# Patient Record
Sex: Male | Born: 2000 | Race: Black or African American | Hispanic: No | Marital: Single | Smoking: Never smoker
Health system: Southern US, Community
[De-identification: ages and names within clinical notes are randomized; demographics above are authoritative.]

## PROBLEM LIST (undated history)

## (undated) HISTORY — PX: KNEE ARTHROSCOPY: SUR90

---

## 2019-03-23 ENCOUNTER — Other Ambulatory Visit: Payer: Self-pay

## 2019-03-23 ENCOUNTER — Encounter: Payer: Self-pay | Admitting: Specialist

## 2019-03-23 ENCOUNTER — Ambulatory Visit: Payer: Self-pay

## 2019-03-23 ENCOUNTER — Ambulatory Visit: Payer: Federal, State, Local not specified - PPO | Admitting: Specialist

## 2019-03-23 VITALS — BP 123/78 | Ht 74.0 in | Wt 250.0 lb

## 2019-03-23 DIAGNOSIS — M25532 Pain in left wrist: Secondary | ICD-10-CM

## 2019-03-23 DIAGNOSIS — S6982XA Other specified injuries of left wrist, hand and finger(s), initial encounter: Secondary | ICD-10-CM

## 2019-03-23 DIAGNOSIS — S63501D Unspecified sprain of right wrist, subsequent encounter: Secondary | ICD-10-CM

## 2019-03-23 NOTE — Progress Notes (Addendum)
Office Visit Note   Patient: Leonard Perkins           Date of Birth: [birth year]           MRN: [id number] Visit Date: 03/23/2019              Requested by: No referring provider defined for this encounter. PCP: No primary care provider on file.   Assessment & Plan: Visit Diagnoses:  1. Pain in left wrist   2. Sprain of right wrist, subsequent encounter   3. 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  Motrin  or narprosyn for pain. Work on wrist curls to strength the wrist flexors and extensors.   Follow-Up Instructions: Return in about 4 weeks (around 04/20/2019).   Orders:  Orders Placed This Encounter  Procedures  . XR Wrist Complete Left  . MR Wrist Left w/o contrast   No orders of the defined types were placed in this encounter.     Procedures: No procedures performed   Clinical Data: No additional findings.   Subjective: Chief Complaint  Patient presents with  . 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 Had history of COVID 19 positive illness with loss of sense of taste and smell and this was almost one month ago and  His taste and smell has returned. He has been having left wrist pain for 3.5 to 4 weeks. The pain in the left wrist began while doing power clings and when transitioning to lifting to flexion of the left elbow the left elbow hit his left thigh and he hyperextended the left wrist. He hurt the right wrist in the past in HS as Junior he was doing a reach block and team mate ran into his right dorsal wrist with his body. He plays Defensive End.  Pain in the left wrist, with flexion or extension and forced flexion or extension. When he  applies weight across the left wrist, power Clings, dumb bell curls and doing back squats with the wrist hyper extended to hold weights on His shoulders is pain. Decrease grip strength left hand with ulnar wrist pain and weakness in Lifting that persists with swelling in the left wrist.       West Progression Recent Vital Signs  BP 123/78 (BP Location: Left Arm, Patient Position: Sitting, Cuff Size: Normal)   Ht 6\' 2"  (1.88 m)   Wt 250 lb (113.4 kg)   BMI 32.10 kg/m    History reviewed. No pertinent past medical history.   Expected Discharge Date    Diet Order    None      VTE Documentation     Work Intensity Score/Level of Care    @LEVELOFCARE @   Mobility       Significant Events   DC Barriers  Abnormal Labs:  03/23/2019, 10:01 AM -4 e   Review of Systems  Constitutional: Negative.  Negative for activity change, appetite change, chills, diaphoresis, fatigue, fever and unexpected weight change.  HENT: Negative.   Eyes: Negative.  Negative for photophobia and redness.  Respiratory: Negative.  Negative for apnea, cough, choking, chest tightness, shortness of breath, wheezing and stridor.  Cardiovascular: Negative.   Gastrointestinal: Negative.  Negative for abdominal distention, abdominal pain, anal bleeding, blood in stool, constipation, diarrhea, nausea, rectal pain and vomiting.  Endocrine: Negative.  Negative for cold intolerance, heat intolerance, polydipsia, polyphagia and polyuria.  Genitourinary: Negative.  Negative for decreased urine volume, difficulty urinating, discharge, dysuria, enuresis, flank pain, frequency, genital sores, hematuria, penile pain, penile swelling, scrotal swelling, testicular pain and urgency.  Musculoskeletal: Negative.   Skin: Negative.   Allergic/Immunologic: Negative.  Negative for environmental allergies, food allergies and immunocompromised state.  Neurological: Negative.   Hematological: Negative.     Psychiatric/Behavioral: Negative.      Objective: Vital Signs: BP 123/78 (BP Location: Left Arm, Patient Position: Sitting, Cuff Size: Normal)   Ht 6\' 2"  (1.88 m)   Wt 250 lb (113.4 kg)   BMI 32.10 kg/m   Physical Exam Constitutional:      Appearance: He is well-developed.  HENT:     Head: Normocephalic and atraumatic.  Eyes:     Pupils: Pupils are equal, round, and reactive to light.  Pulmonary:     Effort: Pulmonary effort is normal.     Breath sounds: Normal breath sounds.  Abdominal:     General: Bowel sounds are normal.     Palpations: Abdomen is soft.  Musculoskeletal:        General: Normal range of motion.     Cervical back: Normal range of motion and neck supple.  Skin:    General: Skin is warm and dry.  Neurological:     Mental Status: He is alert and oriented to person, place, and time.  Psychiatric:        Behavior: Behavior normal.        Thought Content: Thought content normal.        Judgment: Judgment normal.     Right Hand Exam   Tenderness  The patient is experiencing no tenderness.   Range of Motion  Wrist  Extension: normal  Flexion: normal  Pronation: normal  Supination: normal  Hand  MP Thumb: normal  MP Index: normal  MP Middle: normal  PIP Index: normal  PIP Middle: normal  DIP Thumb: normal  DIP Index: normal  DIP Middle: normal    Left Hand Exam   Tenderness  The patient is experiencing tenderness in the dorsal area, palmar area and ulnar area.   Range of Motion  Wrist  Extension: normal  Flexion: normal  Pronation: normal  Supination: normal  Hand  MP Thumb: normal  MP Index: normal  MP Middle: normal  MP Ring: normal  MP Little: normal  PIP Index: normal  PIP Middle: normal  PIP Ring: normal  PIP Little: normal  DIP Thumb: normal  DIP Index: normal  DIP Middle: normal  DIP Ring: normal  DIP Little: normal   Muscle Strength  Wrist extension: 4/5  Wrist flexion: 4/5  Grip:  4/5   Tests  Phalen's  Sign: negative Tinel's sign (median nerve): negative Finkelstein's test: negative  Other  Erythema: absent Scars: absent Sensation: normal Pulse: present  Comments:  Weak left hand grip 4-/5 Pain with ulnar deviation and flexion and extension over left distal ulna-carpal joint. Watson Click test is negative.        Specialty Comments:  No specialty comments available.  Imaging: XR Wrist Complete Left  Result Date: 03/23/2019 Left wrist AP lateral and oblique radiographs show the joint lines are well maintained, no DISI or VISI instability noted, no acute changes. Minimal ulnar minus  variation no abnormal calcification. Wrist DF and VF and ulnar and radial stress show no abnormal widening of the intercarpal joints.     PMFS History: There are no problems to display for this patient.  History reviewed. No pertinent past medical history.  Family History  Problem Relation Age of Onset  . Diabetes Father   . Hypertension Father   . Bronchitis Sister     Past Surgical History:  Procedure Laterality Date  . KNEE ARTHROSCOPY     Social History   Occupational History  . Not on file  Tobacco Use  . Smoking status: Never Smoker  . Smokeless tobacco: Never Used  Substance and Sexual Activity  . Alcohol use: Never  . Drug use: Never  . Sexual activity: Never

## 2019-03-23 NOTE — Patient Instructions (Signed)
Left wrist splint for when performing exercises and weight lifting  MRI left wrist to evaluate for TFCC ligamentous complex injury.  Motrin  or narprosyn for pain. Work on wrist curls to strength the wrist flexors and extensors.

## 2019-03-24 ENCOUNTER — Telehealth: Payer: Self-pay | Admitting: Specialist

## 2019-03-24 NOTE — Telephone Encounter (Signed)
Paitent sched for 03/27/2019 @ 230

## 2019-03-24 NOTE — Telephone Encounter (Signed)
Spoke with patient's mother Stoney Bang concerning appointment for MRI review with Dr. Louanne Skye. Juanita advised patient will be leaving for school and will need to have the MRI  review done the week of the 22nd. The MRI is scheduled 03/28/2019. The number to contact [facility] [place] is [phone]

## 2019-03-25 ENCOUNTER — Other Ambulatory Visit: Payer: Self-pay

## 2019-03-25 ENCOUNTER — Ambulatory Visit (HOSPITAL_BASED_OUTPATIENT_CLINIC_OR_DEPARTMENT_OTHER)
Admission: RE | Admit: 2019-03-25 | Discharge: 2019-03-25 | Disposition: A | Discharge status: Home / Self Care | Payer: Federal, State, Local not specified - PPO | Source: Ambulatory Visit | Attending: Specialist | Admitting: Specialist

## 2019-03-25 DIAGNOSIS — S6982XA Other specified injuries of left wrist, hand and finger(s), initial encounter: Secondary | ICD-10-CM | POA: Diagnosis present

## 2019-03-25 DIAGNOSIS — S63501D Unspecified sprain of right wrist, subsequent encounter: Secondary | ICD-10-CM | POA: Diagnosis present

## 2019-03-25 DIAGNOSIS — M25532 Pain in left wrist: Secondary | ICD-10-CM | POA: Diagnosis not present

## 2019-03-27 ENCOUNTER — Other Ambulatory Visit: Payer: Self-pay

## 2019-03-27 ENCOUNTER — Ambulatory Visit: Payer: Federal, State, Local not specified - PPO | Admitting: Specialist

## 2019-03-27 ENCOUNTER — Encounter: Payer: Self-pay | Admitting: Specialist

## 2019-03-27 VITALS — BP 140/73 | HR 68 | Ht 75.0 in | Wt 253.0 lb

## 2019-03-27 DIAGNOSIS — S66912A Strain of unspecified muscle, fascia and tendon at wrist and hand level, left hand, initial encounter: Secondary | ICD-10-CM

## 2019-03-27 DIAGNOSIS — S63502A Unspecified sprain of left wrist, initial encounter: Secondary | ICD-10-CM | POA: Diagnosis not present

## 2019-03-27 NOTE — Patient Instructions (Signed)
Use wrist splint for one month while performing weight lifting and exercises that place you at risk of hyperextension of the wrist.  Voltaren gel 2-4 grams applied to the wrist 3-4 times a day Ice the wrist after exercise and heat and stretching prior to exercise.  This should get better as time goes by. Trainer before playing may help with stability.

## 2019-03-27 NOTE — Progress Notes (Addendum)
Office Visit Note   Patient: Leonard Perkins           Date of Birth: January 27, 2001           MRN: [id number] Visit Date: 03/27/2019              Requested by: No referring provider defined for this encounter. PCP: Patient, No Pcp Per   Assessment & Plan: Visit Diagnoses:  1. Sprain and strain of left wrist     Plan: Use wrist splint for one month while performing weight lifting and exercises that place you at risk of hyperextension of the wrist.  Voltaren gel 2-4 grams applied to the wrist 3-4 times a day Ice the wrist after exercise and heat and stretching prior to exercise.  This should get better as time goes by. Trainer before playing may help with stability.   Follow-Up Instructions: Return if symptoms worsen or fail to improve.   Orders:  No orders of the defined types were placed in this encounter.  No orders of the defined types were placed in this encounter.     Procedures: No procedures performed   Clinical Data: No additional findings.   Subjective: Chief Complaint  Patient presents with  . 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 Underwent MRI due to persistent weakness and left wrist ulnar joint pain.   Review of Systems  Constitutional: Negative.   HENT: Negative.   Eyes: Negative.   Respiratory: Negative.   Cardiovascular: Negative.   Gastrointestinal: Negative.   Endocrine: Negative.   Genitourinary: Negative.   Musculoskeletal: Negative.   Skin: Negative.   Allergic/Immunologic: Negative.   Neurological: Negative.   Hematological: Negative.   Psychiatric/Behavioral: Negative.      Objective: Vital Signs: BP 140/73   Pulse 68   Ht 6\' 3"  (1.905 m)   Wt 253 lb (114.8 kg)   BMI 31.62 kg/m   Physical Exam Constitutional:      Appearance: He is well-developed.  HENT:     Head: Normocephalic and atraumatic.  Eyes:   Pupils: Pupils are equal, round, and reactive to light.  Pulmonary:     Effort: Pulmonary effort is normal.     Breath sounds: Normal breath sounds.  Abdominal:     General: Bowel sounds are normal.     Palpations: Abdomen is soft.  Musculoskeletal:        General: Normal range of motion.     Cervical back: Normal range of motion and neck supple.  Skin:    General: Skin is warm and dry.  Neurological:     Mental Status: He is alert and oriented to person, place, and time.  Psychiatric:        Behavior: Behavior normal.        Thought Content: Thought content normal.        Judgment: Judgment normal.     Left Ankle Exam   Comments:  Laxity,  Of the left wrist full ROM.      Specialty Comments:  No specialty comments available.  Imaging: No results found.   PMFS History: There are no problems to display for this patient.  History reviewed. No pertinent past medical history.  Family History  Problem Relation Age of Onset  . Diabetes Father   . Hypertension Father   . Bronchitis Sister     Past Surgical History:  Procedure Laterality Date  . KNEE ARTHROSCOPY     Social History   Occupational History  . Not on file  Tobacco Use  . Smoking status: Never Smoker  . Smokeless tobacco: Never Used  Substance and Sexual Activity  . Alcohol use: Never  . Drug use: Never  . Sexual activity: Never

## 2019-03-28 ENCOUNTER — Other Ambulatory Visit: Payer: Federal, State, Local not specified - PPO

## 2021-03-15 IMAGING — MR MR WRIST*L* W/O CM
7 series · 40 of 40 positions shown · non-contrast
Comparison: Left wrist x-rays dated March 23, 2019

CLINICAL DATA: Persistent ulnar-sided wrist pain since weight
lifting injury 4 weeks ago.

EXAM:
MR OF THE LEFT WRIST WITHOUT CONTRAST
TECHNIQUE: Multiplanar, multisequence MR imaging of the left wrist was
performed. No intravenous contrast was administered.

[Series 3: T2 fat-sat · axial · 3.0mm · 0.38mm/px · z∈[-15,+57]mm · 8 of 22 slices shown (1 of 2)]
[im 1/22]
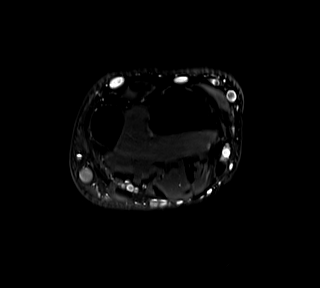
[im 4/22]
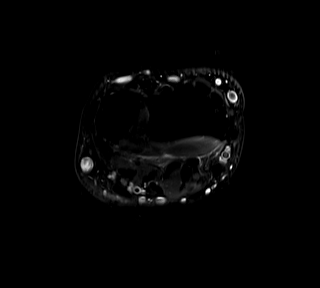
[im 7/22]
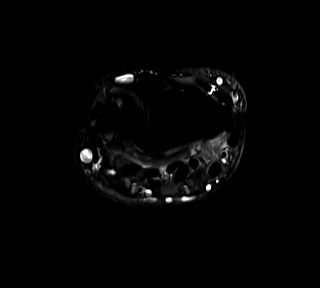
[im 10/22]
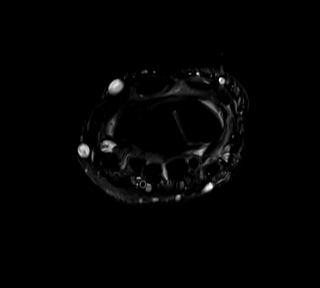
[im 13/22]
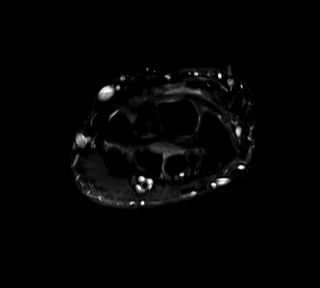
[im 16/22]
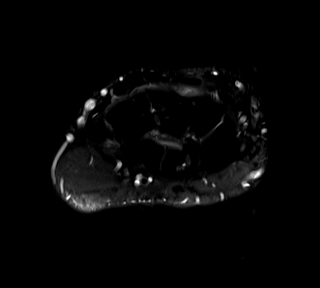
[im 19/22]
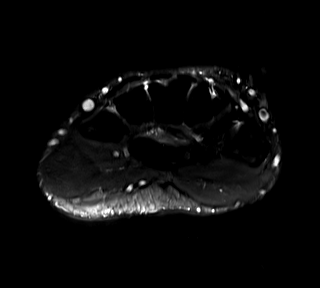
[im 22/22]
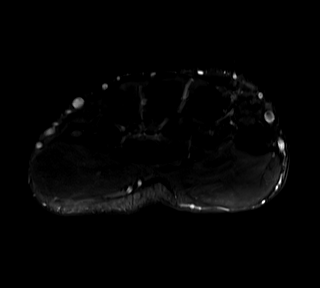

[Series 4: T1 · axial · 3.0mm · 0.38mm/px · z∈[-15,+57]mm · 7 of 22 slices shown]
[im 1/22]
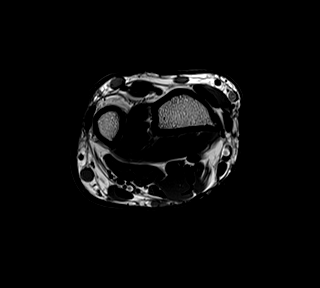
[im 4/22]
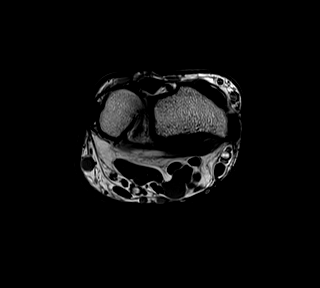
[im 8/22]
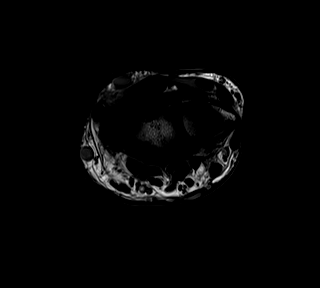
[im 11/22]
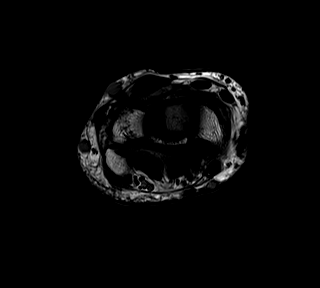
[im 15/22]
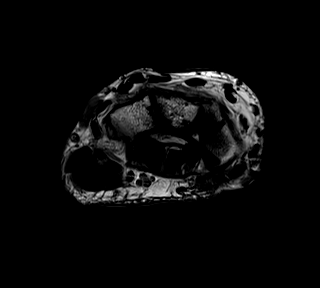
[im 18/22]
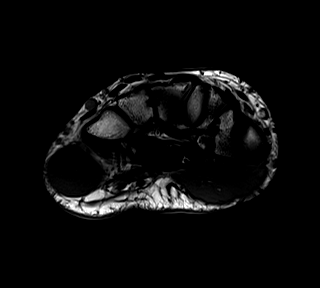
[im 22/22]
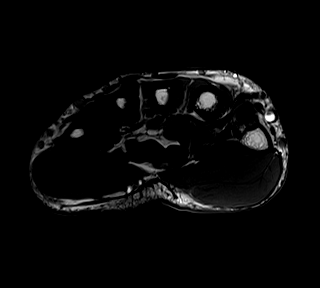

[Series 5: T2 fat-sat · coronal · 3.0mm · 0.47mm/px · 5 of 17 slices shown (2 of 2)]
[im 1/17]
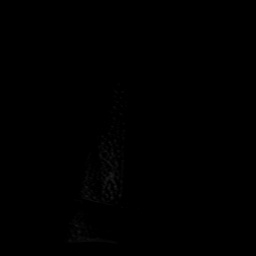
[im 5/17]
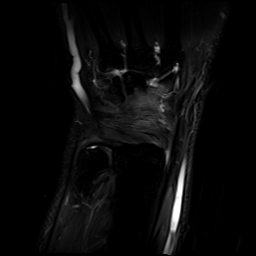
[im 9/17]
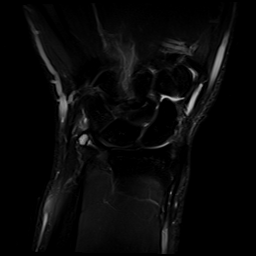
[im 13/17]
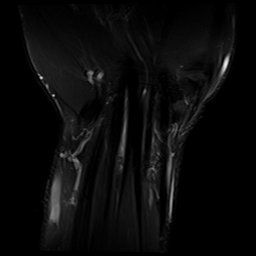
[im 17/17]
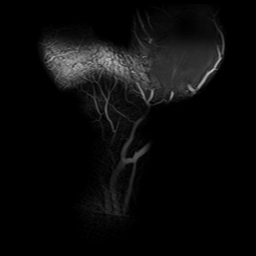

[Series 7: PD fat-sat · coronal · 3.0mm · 0.47mm/px · 5 of 17 slices shown (1 of 2)]
[im 1/17]
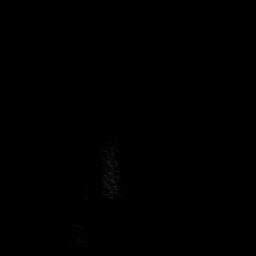
[im 5/17]
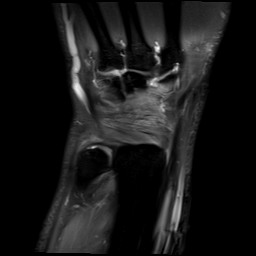
[im 9/17]
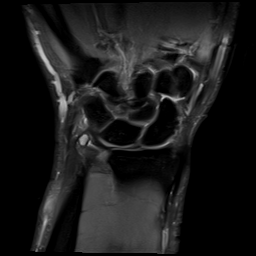
[im 13/17]
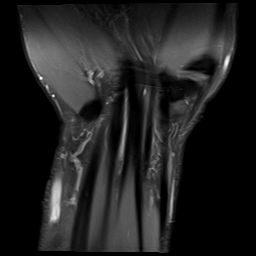
[im 17/17]
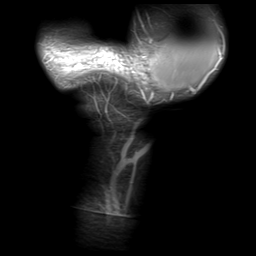

[Series 8: PD fat-sat · sagittal · 3.0mm · 0.47mm/px · 7 of 23 slices shown (2 of 2)]
[im 1/23]
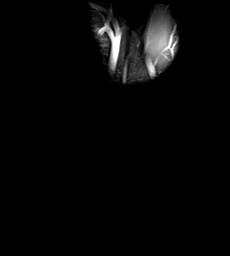
[im 4/23]
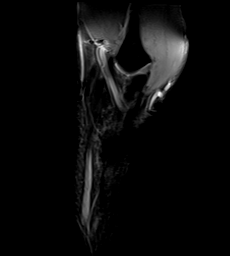
[im 8/23]
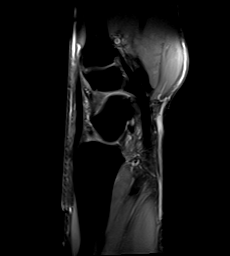
[im 12/23]
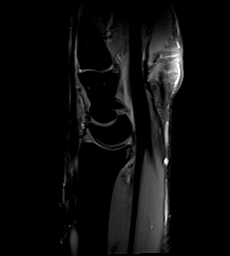
[im 15/23]
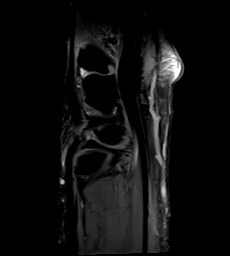
[im 19/23]
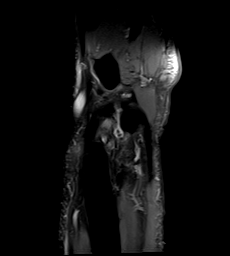
[im 23/23]
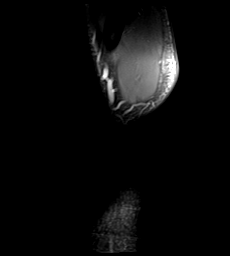

[Series 9: T2 · axial · 3.0mm · 0.38mm/px · z∈[-15,+57]mm · 7 of 22 slices shown]
[im 1/22]
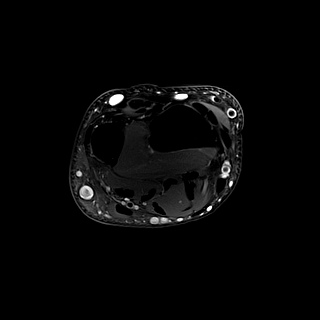
[im 4/22]
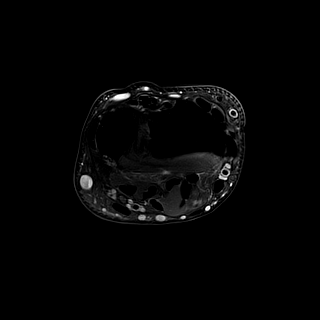
[im 8/22]
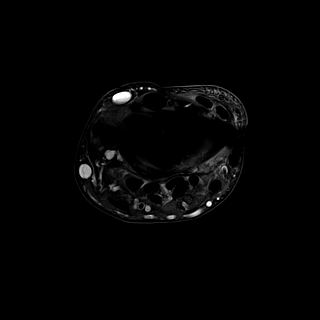
[im 11/22]
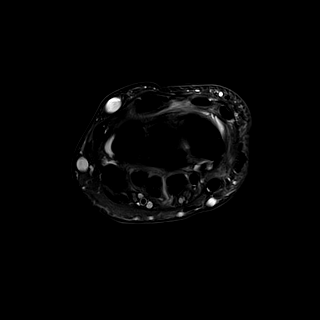
[im 15/22]
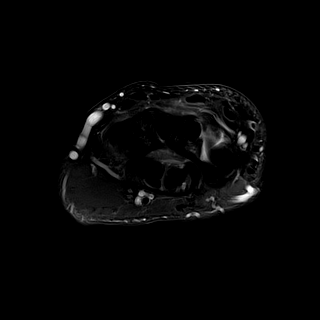
[im 18/22]
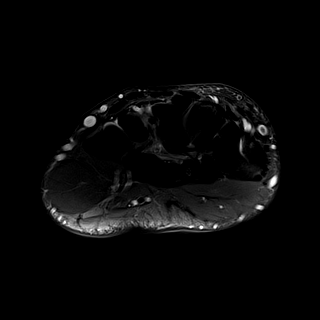
[im 22/22]
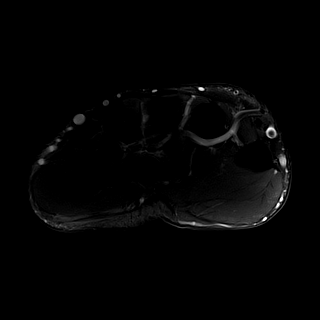

[Series 100: hx · axial · 8.0mm · 0.59mm/px · 1 of 3 slices shown]
[im 1/3]
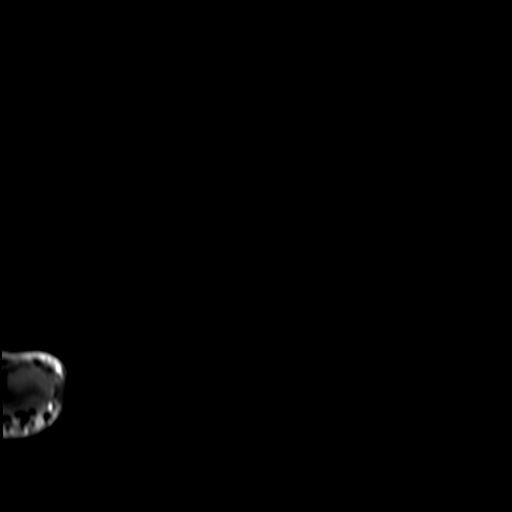

[40 of 40 positions shown; findings below may reference images not displayed]

FINDINGS: Ligaments: Intact scapholunate and lunotriquetral ligaments.

Triangular fibrocartilage: Intact TFCC.

Tendons: Intact flexor and extensor compartment tendons.

Carpal tunnel/median nerve: Normal carpal tunnel. Normal median
nerve.

Guyon's canal: Normal.

Joint/cartilage: Trace radiocarpal joint effusion. No chondral
defect.

Bones/carpal alignment: No acute fracture or dislocation. No
suspicious marrow signal abnormality. Normal alignment. No rest of
osseous lesion.

Other: None.
IMPRESSION: 1. Intact TFCC.  No acute abnormality.
